# Patient Record
Sex: Male | Born: 1960 | Hispanic: Yes | Marital: Married | Smoking: Never smoker
Health system: Southern US, Community
[De-identification: ages and names within clinical notes are randomized; demographics above are authoritative.]

---

## 2013-10-26 ENCOUNTER — Emergency Department: Payer: Self-pay | Admitting: Emergency Medicine

## 2014-09-24 ENCOUNTER — Encounter: Admit: 2014-09-24 | Payer: Self-pay

## 2014-09-24 ENCOUNTER — Emergency Department: Payer: Self-pay

## 2014-09-24 ENCOUNTER — Emergency Department
Admission: EM | Admit: 2014-09-24 | Discharge: 2014-09-24 | Disposition: A | Discharge status: Home / Self Care | Payer: Self-pay | Attending: Emergency Medicine | Admitting: Emergency Medicine

## 2014-09-24 DIAGNOSIS — Z79899 Other long term (current) drug therapy: Secondary | ICD-10-CM | POA: Insufficient documentation

## 2014-09-24 DIAGNOSIS — R079 Chest pain, unspecified: Secondary | ICD-10-CM | POA: Insufficient documentation

## 2014-09-24 DIAGNOSIS — R1011 Right upper quadrant pain: Secondary | ICD-10-CM | POA: Insufficient documentation

## 2014-09-24 DIAGNOSIS — R112 Nausea with vomiting, unspecified: Secondary | ICD-10-CM | POA: Insufficient documentation

## 2014-09-24 DIAGNOSIS — R52 Pain, unspecified: Secondary | ICD-10-CM

## 2014-09-24 LAB — CBC WITH DIFFERENTIAL/PLATELET
BASOS ABS: 0.1 10*3/uL (ref 0–0.1)
Basophils Relative: 1 %
Eosinophils Absolute: 0.1 10*3/uL (ref 0–0.7)
Eosinophils Relative: 1 %
HCT: 42.1 % (ref 40.0–52.0)
HEMOGLOBIN: 14.5 g/dL (ref 13.0–18.0)
LYMPHS ABS: 2.1 10*3/uL (ref 1.0–3.6)
LYMPHS PCT: 23 %
MCH: 30.2 pg (ref 26.0–34.0)
MCHC: 34.5 g/dL (ref 32.0–36.0)
MCV: 87.7 fL (ref 80.0–100.0)
Monocytes Absolute: 0.6 10*3/uL (ref 0.2–1.0)
Monocytes Relative: 6 %
NEUTROS ABS: 6.5 10*3/uL (ref 1.4–6.5)
NEUTROS PCT: 69 %
PLATELETS: 186 10*3/uL (ref 150–440)
RBC: 4.8 MIL/uL (ref 4.40–5.90)
RDW: 13.8 % (ref 11.5–14.5)
WBC: 9.4 10*3/uL (ref 3.8–10.6)

## 2014-09-24 LAB — URINALYSIS COMPLETE WITH MICROSCOPIC (ARMC ONLY)
BACTERIA UA: NONE SEEN
Bilirubin Urine: NEGATIVE
GLUCOSE, UA: NEGATIVE mg/dL
HGB URINE DIPSTICK: NEGATIVE
Leukocytes, UA: NEGATIVE
NITRITE: NEGATIVE
PH: 8 (ref 5.0–8.0)
Protein, ur: NEGATIVE mg/dL
RBC / HPF: NONE SEEN RBC/hpf (ref 0–5)
Specific Gravity, Urine: 1.014 (ref 1.005–1.030)

## 2014-09-24 LAB — COMPREHENSIVE METABOLIC PANEL
ALT: 27 U/L (ref 17–63)
AST: 30 U/L (ref 15–41)
Albumin: 4.2 g/dL (ref 3.5–5.0)
Alkaline Phosphatase: 96 U/L (ref 38–126)
Anion gap: 10 (ref 5–15)
BUN: 11 mg/dL (ref 6–20)
CALCIUM: 8.8 mg/dL — AB (ref 8.9–10.3)
CO2: 26 mmol/L (ref 22–32)
Chloride: 103 mmol/L (ref 101–111)
Creatinine, Ser: 0.79 mg/dL (ref 0.61–1.24)
GFR calc Af Amer: >60 mL/min (ref >60)
GFR calc non Af Amer: >60 mL/min (ref >60)
GLUCOSE: 153 mg/dL — AB (ref 65–99)
POTASSIUM: 3.3 mmol/L — AB (ref 3.5–5.1)
Sodium: 139 mmol/L (ref 135–145)
TOTAL PROTEIN: 7.6 g/dL (ref 6.5–8.1)
Total Bilirubin: 2.3 mg/dL — ABNORMAL HIGH (ref 0.3–1.2)

## 2014-09-24 LAB — LIPASE, BLOOD: Lipase: 34 U/L (ref 22–51)

## 2014-09-24 LAB — TROPONIN I: Troponin I: <0.03 ng/mL (ref <0.031)

## 2014-09-24 MED ORDER — MORPHINE SULFATE 4 MG/ML IJ SOLN
INTRAMUSCULAR | Status: AC
  Administered 2014-09-24: 4 mg via INTRAVENOUS
  Filled 2014-09-24: qty 1

## 2014-09-24 MED ORDER — OMEPRAZOLE 40 MG PO CPDR: 40.0000 mg | DELAYED_RELEASE_CAPSULE | Freq: Every day | ORAL | Status: AC

## 2014-09-24 MED ORDER — KETOROLAC TROMETHAMINE 30 MG/ML IJ SOLN
30.0000 mg | Freq: Once | INTRAMUSCULAR | Status: AC
  Administered 2014-09-24: 30 mg via INTRAVENOUS

## 2014-09-24 MED ORDER — IOHEXOL 240 MG/ML SOLN
25.0000 mL | Freq: Once | INTRAMUSCULAR | Status: AC | PRN
  Administered 2014-09-24: 25 mL via ORAL

## 2014-09-24 MED ORDER — IOHEXOL 300 MG/ML  SOLN
100.0000 mL | Freq: Once | INTRAMUSCULAR | Status: AC | PRN
  Administered 2014-09-24: 100 mL via INTRAVENOUS

## 2014-09-24 MED ORDER — ONDANSETRON HCL 4 MG PO TABS: 4.0000 mg | ORAL_TABLET | Freq: Three times a day (TID) | ORAL | Status: AC | PRN

## 2014-09-24 MED ORDER — MORPHINE SULFATE 4 MG/ML IJ SOLN
INTRAMUSCULAR | Status: AC
  Administered 2014-09-24: 4 mg
  Filled 2014-09-24: qty 1

## 2014-09-24 MED ORDER — KETOROLAC TROMETHAMINE 30 MG/ML IJ SOLN
INTRAMUSCULAR | Status: AC
  Administered 2014-09-24: 30 mg via INTRAVENOUS
  Filled 2014-09-24: qty 8

## 2014-09-24 MED ORDER — MORPHINE SULFATE 4 MG/ML IJ SOLN
4.0000 mg | Freq: Once | INTRAMUSCULAR | Status: AC
  Administered 2014-09-24: 4 mg via INTRAVENOUS

## 2014-09-24 MED ORDER — ONDANSETRON HCL 4 MG/2ML IJ SOLN
INTRAMUSCULAR | Status: AC
  Administered 2014-09-24: 4 mg
  Filled 2014-09-24: qty 2

## 2014-09-24 NOTE — ED Provider Notes (Signed)
[facility] Emergency Department Provider Note    ____________________________________________  Time seen: 2:40 AM  I have reviewed the triage vital signs and the nursing notes.   HISTORY  Chief Complaint Chest Pain       HPI Corey Dickerson is a 54 y.o. male denies past medical history that presents with sudden onset of severe right upper quadrant abdominal pain radiating to the right side of the back and up into the ribs.  Please note initial triage note states chest pain, but with interpreter present patient states that he is not having any chest pain or chest related symptoms, rather he is having severe pain in his right upper stomach which radiates into the right side of his back.  This is been associated with nausea and vomiting once. He did feel sweaty earlier today. The pain began suddenly at 9:00 this evening and he is been waiting it out at home. Pain is continuing to be severe at 10 at this time. The pain is described as sharp.  Patient does report that approximately one week ago he had a similar episode that lasted less than 2 hours and resolved on its own at home. He did not seek medical care at that time.    Patient denies any fevers chills chest pain or shortness of breath. He denies any dysuria or pain with urination. He denies pain in the right lower abdomen. Normal bowel movement today.   History reviewed. No pertinent past medical history. patient denies past medical history. Patient denies taking any medications. Patient denies any allergies to any medications.  There are no active problems to display for this patient.   History reviewed. No pertinent past surgical history.  Current Outpatient Rx  Name  Route  Sig  Dispense  Refill  . omeprazole (PRILOSEC) 40 MG capsule   Oral   Take 1 capsule (40 mg total) by mouth daily.   30 capsule   1   . ondansetron (ZOFRAN) 4 MG tablet   Oral   Take 1 tablet (4 mg total) by mouth  every 8 (eight) hours as needed for nausea or vomiting.   30 tablet   1     Allergies Review of patient's allergies indicates no known allergies.  History reviewed. No pertinent family history.  Social History History  Substance Use Topics  . Smoking status: Never Smoker   . Smokeless tobacco: Never Used  . Alcohol Use: Yes   Patient is not a smoker, he does occasionally drink but not heavily or in excess. Patient denies any legal drug use.  Review of Systems  Constitutional: Negative for fever. Eyes: Negative for visual changes. ENT: Negative for sore throat. Cardiovascular: Negative for chest pain. Respiratory: Negative for shortness of breath. Gastrointestinal: See history of present illness Genitourinary: Negative for dysuria. Musculoskeletal: Negative for back pain. Skin: Negative for rash. Neurological: Negative for headaches, focal weakness or numbness.   10-point ROS otherwise negative.  ____________________________________________   PHYSICAL EXAM:  VITAL SIGNS: ED Triage Vitals  Enc Vitals Group     BP 09/24/14 0231 153/89 mmHg     Pulse Rate 09/24/14 0231 62     Resp 09/24/14 0231 18     Temp 09/24/14 0231 97.7 F (36.5 C)     Temp Source 09/24/14 0231 Oral     SpO2 09/24/14 0231 100 %     Weight 09/24/14 0228 170 lb (77.111 kg)     Height 09/24/14 0228 5\' 1"  (1.549 m)  Head Cir --      Peak Flow --      Pain Score 09/24/14 0228 10     Pain Loc --      Pain Edu? --      Excl. in GC? --      Constitutional: Alert and oriented. Well appearing and in obvious pain, patient is writhing on the bed unable to get comfortable holding his right hand over his right flank.  Eyes: Conjunctivae are normal. PERRL. Normal extraocular movements. ENT   Head: Normocephalic and atraumatic.   Nose: No congestion/rhinnorhea.   Mouth/Throat: Mucous membranes are moist.   Neck: No stridor. Hematological/Lymphatic/Immunilogical: No cervical  lymphadenopathy. Cardiovascular: Normal rate, regular rhythm. Normal and symmetric distal pulses are present in all extremities. No murmurs, rubs, or gallops. Respiratory: Normal respiratory effort without tachypnea nor retractions. Breath sounds are clear and equal bilaterally. No wheezes/rales/rhonchi. Gastrointestinal: Soft but tender notably in the epigastrium right upper quadrant and some mild pain in the right flank to palpation. There is no groin mass or groin edema or mass lesion noted in the groin region. The right lower quadrant is nontender left lower quadrant is nontender the epigastrium and right upper quadrant are notably tender to palpation.. No distention. No abdominal bruits. There is no CVA tenderness.  Musculoskeletal: Nontender with normal range of motion in all extremities. No joint effusions.  No lower extremity tenderness nor edema. Neurologic:  Normal speech and language. No gross focal neurologic deficits are appreciated. Speech is normal. No gait instability. Skin:  Skin is warm, dry and intact. No rash noted. Psychiatric: Mood and affect are normal. Speech and behavior are normal. Patient exhibits appropriate insight and judgment.  ____________________________________________    LABS (pertinent positives/negatives)  Labs Reviewed  COMPREHENSIVE METABOLIC PANEL - Abnormal; Notable for the following:    Potassium 3.3 (*)    Glucose, Bld 153 (*)    Calcium 8.8 (*)    Total Bilirubin 2.3 (*)    All other components within normal limits  URINALYSIS COMPLETEWITH MICROSCOPIC ([facility])  - Abnormal; Notable for the following:    Color, Urine YELLOW (*)    APPearance HAZY (*)    Ketones, ur TRACE (*)    Squamous Epithelial / LPF 0-5 (*)    All other components within normal limits  CBC WITH DIFFERENTIAL/PLATELET  LIPASE, BLOOD  TROPONIN I     ____________________________________________   EKG  EKG is normal sinus rhythm. Interpreted as a normal EKG. Ventricular  rate 60 PR interval 146 QRS duration 92 QT is 438 axis is normal. There are no ischemic ST changes.  ____________________________________________    RADIOLOGY  Cardiomeg and vasc congestion (per rads read). No free air.  (Clear lungs to exam, normal respirations, normal O2 sat in ER.)  ____________________________________________   PROCEDURES  Procedure(s) performed: None  Critical Care performed: No  ____________________________________________   INITIAL IMPRESSION / ASSESSMENT AND PLAN / ED COURSE  Pertinent labs & imaging results that were available during my care of the patient were reviewed by me and considered in my medical decision making (see chart for details).  Patient presents with severe onset of right upper quadrant and right flank pain radiating to his back. Patient denies cardiopulmonary symptoms. EKG is normal.  In consideration of symptoms and the fact that this is similar symptoms one week ago which have recurred today, reasonable differential at this time appears to be evaluation of the abdomen for consideration of cholelithiasis, nephrolithiasis, acute perforated viscus, in addition  we'll rule out myocardial infarction and obtain chest x-ray. However, patient's primary symptoms and evaluation suggested abdominal etiology for pain.  ----------------------------------------- 6:37 AM on 09/24/2014 -----------------------------------------  Patient reports pain free at present time. Reevaluated and reviewed lab results and ongoing plan with the patient via Spanish interpreter. Given the patient's severity of pain, and recurrence twice the last week, we will proceed with CT imaging to evaluate for possible causes. His total bilirubin is slightly elevated, which gives rise to possible biliary or gallbladder type pathology as etiology.  At this point with his normal EKG and negative troponin, I feel this is very unlikely to be cardiac or pulmonary disease. Reexam  and pain-free status at present time is quite reassuring.   Discussed with Dr. Marva PandaSkulskie. Recommends obtain HIDA scan in the ER today as could have a small (unseen) biliary mass. Discussed with patient, have ordered HIDA as the patient has no primary care physician or easy ability to obtain this study outpatient. If negative, will follow-up with GI Marva Panda(Skulskie) within 1 week. Dr. Marva PandaSkulskie agreeable to outpatient follow-up.  Discussed with Dr. York CeriseForbach plan of care to follow up on HIDA scan.  I discussed with the patient via the Spanish interpreter that even if his HIDA scan is normal and he is discharged today he will definitely need to see a gastroenterologist in follow-up within the next 2 weeks.  Patient and his daughter are both agreeable with the plan of care. At the present time the patient does remain pain-free.  ____________________________________________   FINAL CLINICAL IMPRESSION(S) / ED DIAGNOSES  Final diagnoses:  Right upper quadrant abdominal pain  Hyperbilirubinemia  Nausea and vomiting, vomiting of unspecified type     Sharyn CreamerMark Devin Foskey, MD 10/21/14 [phone]

## 2014-09-24 NOTE — Discharge Instructions (Signed)
It is very important that you go to outpatient imaging in the medical mall at 12:15 PM to have your HIDA scan. Do not have anything to eat or drink until after your scan. Dr. Reyes IvanSkulskie's office will call you with a follow-up appointment early next week, probably Monday. If you develop new or worsening symptoms that concern you, please return to the emergency department.   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 El hgado es el rgano que produce la bilis. La bilis se acumula en la vescula biliar donde es almacenada y concentrada Cuando es necesaria para el proceso digestivo, la bilis se excreta (es volcada) en el intestino delgado. Siempre es necesaria para el procesamiento de las grasas. Por ese motivo, los clicos biliares suelen producirse luego de comer alimentos ricos en grasas. Si un clculo bloquea el conducto (tubo) que conecta la vescula con el intestino delgado, se produce una inflamacin de la vescula (colecistitis). La gammagrafa hepatobiliar es una prueba para evaluar el sistema hepatobiliar (el hgado, la vescula y los conductos). INFORME AL PROFESIONAL QUE LO ASISTE SOBRE LOS SIGUIENTES PUNTOS:  Alergias.  Medicamentos que toma, incluidas hierbas, gotas oftlmicas, medicamentos de venta sin receta y cremas.  Uso de esteroides (por va oral o cremas).  Problemas anteriores debido a anestsicos o a Patent examinerla novocana.  Posibilidad de embarazo, si correspondiera.  Antecedentes de haber tenido cogulos sanguneos (tromboflebitis).  Antecedentes de hemorragias o problemas sanguneos.  Cirugas previas.  Otros problemas de [place] Lakes[place]ud. ANTES DEL PROCEDIMIENTO  No coma nada despus de la medianoche.  Podr tomar los medicamentos con una pequea cantidad de agua. Deber presentarse 60 minutos antes del  procedimiento a menos que el profesional que lo asiste le indique otra cosa. PROCEDIMIENTO  Le colocarn una aguja pequea en el brazo, que permanecer todo el tiempo que dure Lobbyistel examen.  Le inyectarn una pequea cantidad de un material que posee radiactividad de escasa duracin. Esto no debe ocasionarle efectos secundarios.  Mientras se encuentre acostado, colocarn una cmara especial sobre su abdomen (vientre) para Artistdetectar el material inyectado. Esta cmara tomar las imgenes en una pelcula que un radilogo (especialista en la lectura de radiografas) puede evaluar. Este procedimiento determinar cmo est funcionando la vescula biliar.  Luego le administrarn un material denominado "colecistoquintico". ste har que la vescula se contraiga. En ocasiones, esa sustancia produce los mismos sntomas (problemas) que un ataque de vescula o la sensacin de haber comido alimentos ricos en grasas.  La prueba completa demora alrededor Pilgrim's Pridede dos horas. El profesional que lo asiste podr darle ms precisin en el tiempo. Luego de la prueba, podr regresar a su casa y Designer, jewelleryretomar sus actividades normales y la dieta que se le indique. Consulte al profesional que lo asiste el modo en que podr Starbucks Corporationobtener los resultados. Recuerde que es responsabilidad suya retirar los Lubrizol Corporationresultados de las pruebas. No d por sentado que todo est normal si esta informacin no se la brinda el profesional que lo asiste. Document Released: 05/07/2005 Document Revised: 07/30/2011 [facility] Patient Information 2015 [place]ExitCare, [place]LL[place]. This information is not intended to replace advice given to you by your health care provider. Make sure you discuss any questions you have with your health care provider.    Dolor abdominal (Abdominal Pain)  It is very important you follow up with gastroenterology within one to 2 weeks. Please return to the ER right  away if you develop a fever, severe pain in the abdomen again, vomiting, can't stay  hydrated, or other new concerns or symptoms arise.  El dolor puede tener muchas causas. Normalmente la causa del dolor abdominal no es una enfermedad y Scientist, clinical (histocompatibility and immunogenetics)mejorar sin TEFL teachertratamiento. Frecuentemente puede controlarse y tratarse en casa. Su mdico le Medical sales representativerealizar un examen fsico y posiblemente solicite anlisis de sangre y radiografas para ayudar a Chief Strategy Officerdeterminar la gravedad de su dolor. Sin embargo, en IAC/InterActiveCorpmuchos casos, debe transcurrir ms tiempo antes de que se pueda Clinical research associateencontrar una causa evidente del dolor. Antes de llegar a ese punto, es posible que su mdico no sepa si necesita ms pruebas o un tratamiento ms profundo. INSTRUCCIONES PARA EL CUIDADO EN EL HOGAR  Est atento al dolor para ver si hay cambios. Las siguientes indicaciones ayudarn a Architectural technologistaliviar cualquier molestia que pueda sentir:  [place] solo medicamentos de venta libre o recetados, segn las indicaciones del mdico.  No tome laxantes a menos que se lo haya indicado su mdico.  Pruebe con Neomia Dearuna dieta lquida absoluta (caldo, t o agua) segn se lo indique su mdico. Introduzca gradualmente una dieta normal, segn su tolerancia. SOLICITE ATENCIN MDICA SI:  Tiene dolor abdominal sin explicacin.  Tiene dolor abdominal relacionado con nuseas o diarrea.  Tiene dolor cuando orina o defeca.  Experimenta dolor abdominal que lo despierta de noche.  Tiene dolor abdominal que empeora o mejora cuando come alimentos.  Tiene dolor abdominal que empeora cuando come alimentos grasosos.  Tiene fiebre. SOLICITE ATENCIN MDICA DE INMEDIATO SI:   El dolor no desaparece en un plazo mximo de 2horas.  No deja de (vomitar).  El Engineer, miningdolor se siente solo en partes del abdomen, como el lado derecho o la parte inferior izquierda del abdomen.  Evaca materia fecal sanguinolenta o negra, de aspecto alquitranado. ASEGRESE DE QUE:  Comprende estas instrucciones.  Controlar su afeccin.  Recibir ayuda de inmediato si no mejora o si empeora. Document  Released: 05/07/2005 Document Revised: 05/12/2013 [facility] MontgomeryExitCare Patient Information 2015 [employer] Heart UniversityExitCare, MarylandLLC. This information is not intended to replace advice given to you by your health care provider. Make sure you discuss any questions you have with your health care provider.

## 2014-09-24 NOTE — ED Notes (Signed)
Pt drinking oral contrast, NAD noted.

## 2014-09-24 NOTE — ED Provider Notes (Signed)
-----------------------------------------   7:30 AM on 09/24/2014 -----------------------------------------  Assuming care from Dr. Fanny BienQuale.  In short, Corey Dickerson is a 54 y.o. male with a chief complaint of Chest Pain  which actually turned out to be more right upper quadrant pain.  Refer to the original H&P for additional details.  The current plan of care is to have Dr. Fanny BienQuale speak with Dr. Marva PandaSkulskie and determine recommendations.  ----------------------------------------- 9:22 AM on 09/24/2014 -----------------------------------------  Dr. Marva PandaSkulskie recommended a HIDA scan which Dr. Fanny BienQuale ordered. However, the patient cannot have this until after 1 PM, and it is not appropriate to keep him in the emergency department for that length of time. I spoke again with Dr. Marva PandaSkulskie who recommended contacting radiology and scheduling an outpatient HIDA scan for today rather than admitting the patient simply for the study. Dr. Marva PandaSkulskie will then schedule a follow-up appointment with the patient for Monday. My secretary is currently on the phone with radiology and attempt to schedule the test for today.  ----------------------------------------- 10:12 AM on 09/24/2014 -----------------------------------------  We arrange for an outpatient study at 1 PM today. The patient is to arrive at 12:15 PM at outpatient imaging. I discussed this with the patient via an interpreter to make sure there is no misunderstanding. The patient understands he is to remain nothing by mouth. The patient also understands that Dr. Reyes IvanSkulskie's office will contact him for follow-up appointment on Monday. He is in no acute distress, ambulates well, and I'm providing a prescription for Zofran and omeprazole as per Dr. Reyes IvanSkulskie's recommendations. I gave him my usual and customary return precautions.  Corey Roseory Finola Rosal, MD 09/24/14 1013

## 2014-09-24 NOTE — ED Notes (Signed)
Chest pain x 1 week today became worse, has had vomiting and diaphoresis.

## 2014-09-24 NOTE — ED Notes (Signed)
   09/24/14 0313  Abdominal  Gastrointestinal (WDL) X  Tenderness Tender (epigastric and right upper quad that moves into back)  Pt c/o epigastric pain x 1 week but worsening tonight at 9pm.

## 2014-09-24 NOTE — ED Notes (Signed)
Patient transported to CT 

## 2014-09-24 NOTE — ED Notes (Signed)
   09/24/14 0312  Cardiac  Cardiac (WDL) WDL  Bedside Cardiac Monitor On Yes  Bedside Cardiac Audible Yes  Bedside Cardiac Alarms Set Yes  NSR

## 2014-09-24 NOTE — ED Notes (Signed)
Pt continues to drink contrast.

## 2016-02-01 ENCOUNTER — Ambulatory Visit: Payer: Self-pay | Admitting: Ophthalmology

## 2016-02-14 ENCOUNTER — Ambulatory Visit: Payer: Self-pay

## 2016-02-23 ENCOUNTER — Ambulatory Visit: Payer: Self-pay | Admitting: Ophthalmology

## 2016-03-01 ENCOUNTER — Ambulatory Visit: Payer: Self-pay | Admitting: Ophthalmology

## 2016-04-21 IMAGING — CT CT ABD-PELV W/ CM
2 of 5 series · 16 of 46 positions shown, 18 images · IV contrast (omnipaque)
Comparison: None.

CLINICAL DATA: Vomiting and diaphoresis. Right upper quadrant pain
radiating toward back

EXAM:
CT ABDOMEN AND PELVIS WITH CONTRAST
TECHNIQUE: Multidetector CT imaging of the abdomen and pelvis was performed
using the standard protocol following bolus administration of
intravenous contrast. Oral contrast was also administered.
CONTRAST:  100mL OMNIPAQUE IOHEXOL 300 MG/ML  SOLN

[Series 2: routine abd pel with · axial · 0.75mm/px · z∈[-770,-356]mm · 13 of 93 slices shown, 15 images]
[im 5/93  soft-tissue]
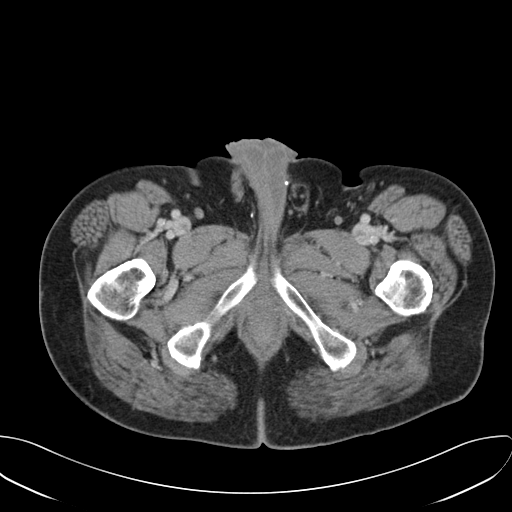
[im 5/93  bone]
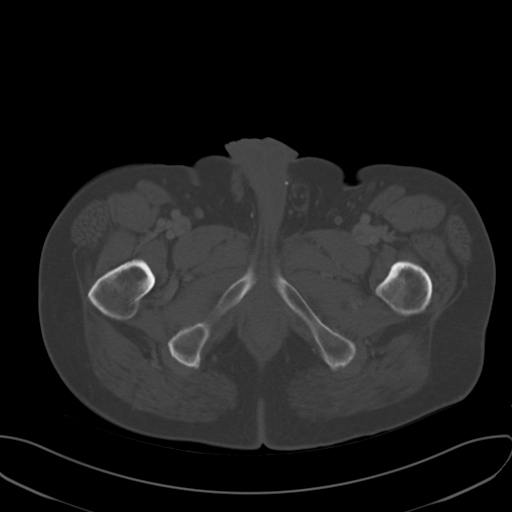
[im 15/93  soft-tissue]
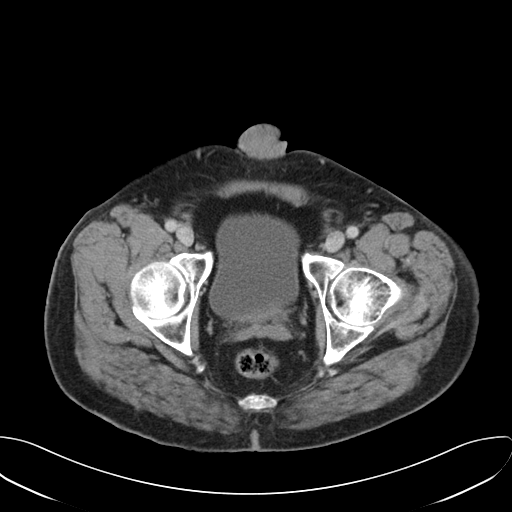
[im 20/93  soft-tissue]
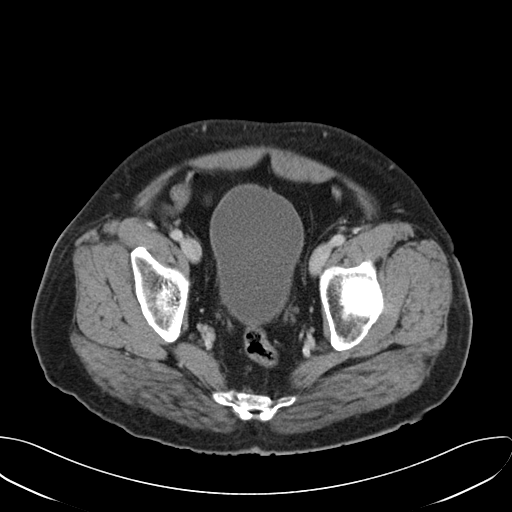
[im 25/93  soft-tissue]
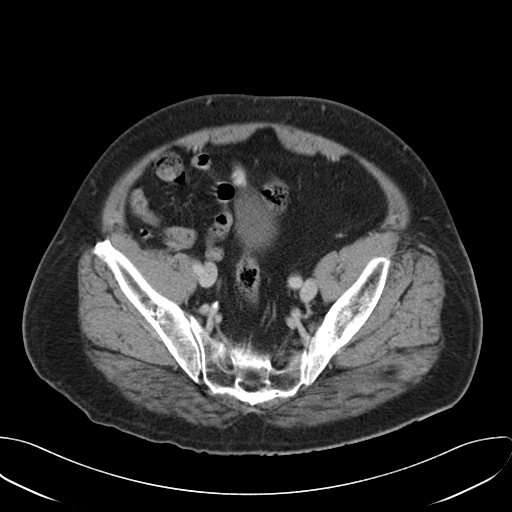
[im 34/93  soft-tissue]
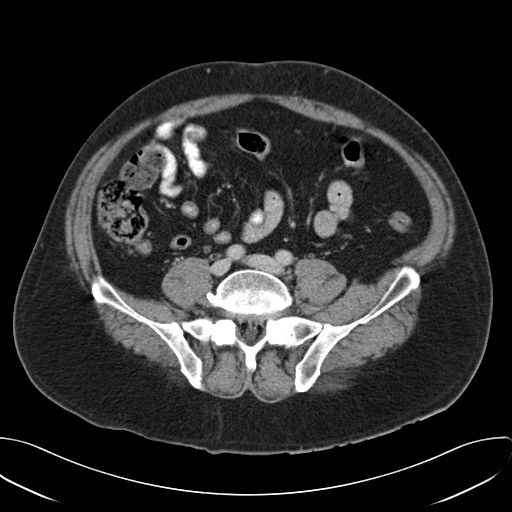
[im 39/93  soft-tissue]
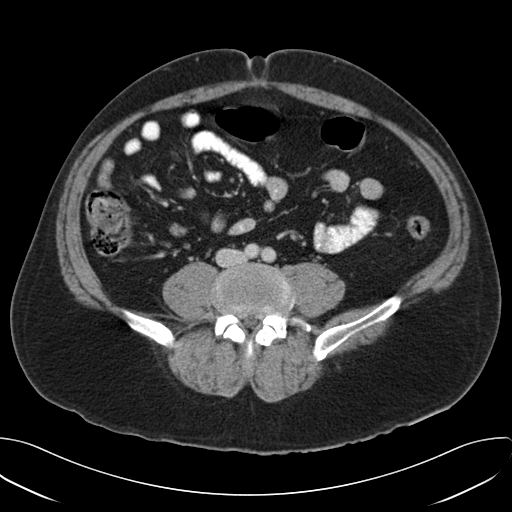
[im 49/93  soft-tissue]
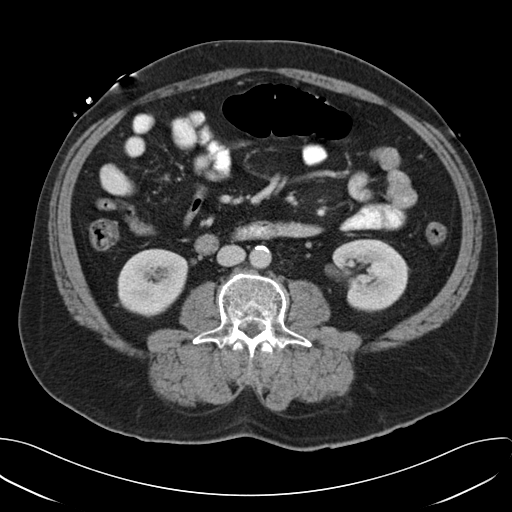
[im 54/93  soft-tissue]
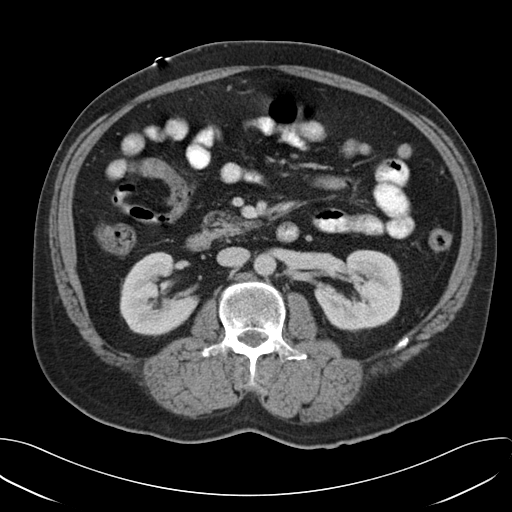
[im 59/93  soft-tissue]
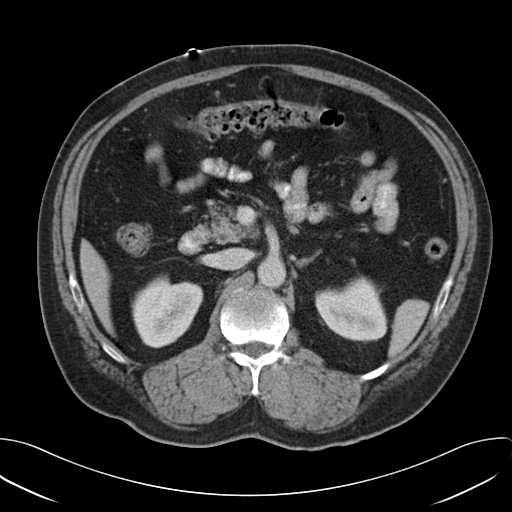
[im 59/93  bone]
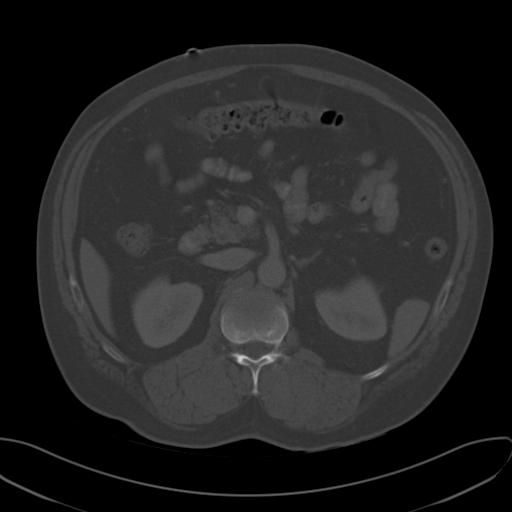
[im 68/93  soft-tissue]
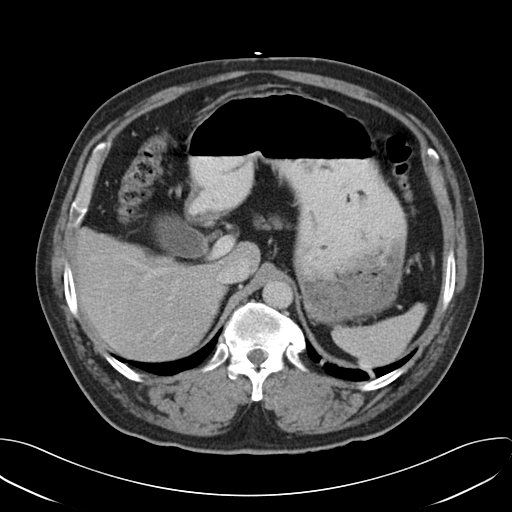
[im 73/93  soft-tissue]
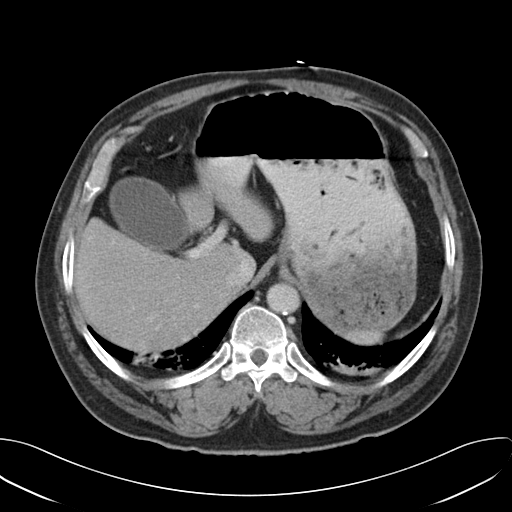
[im 78/93  soft-tissue]
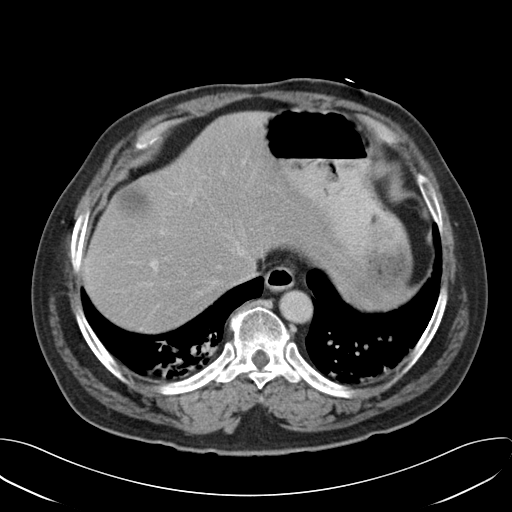
[im 88/93  soft-tissue]
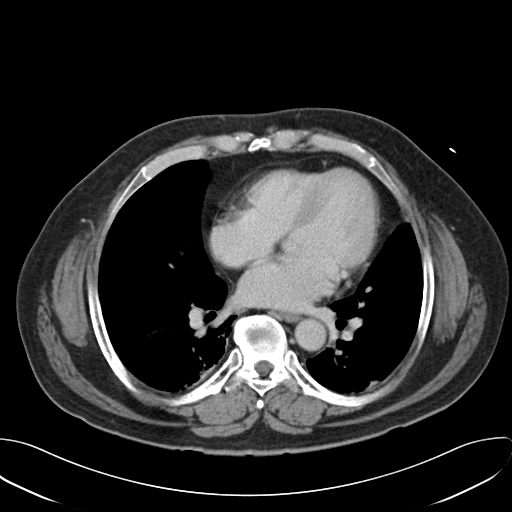

[Series 6: cor routine abd pel with · coronal · 0.96mm/px · 3 of 157 slices shown]
[im 53/157  soft-tissue]
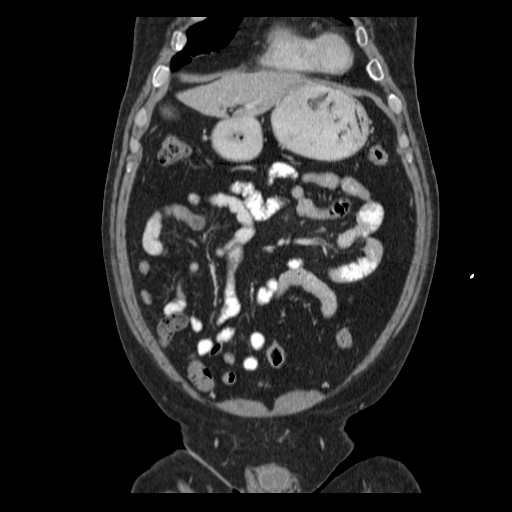
[im 70/157  soft-tissue]
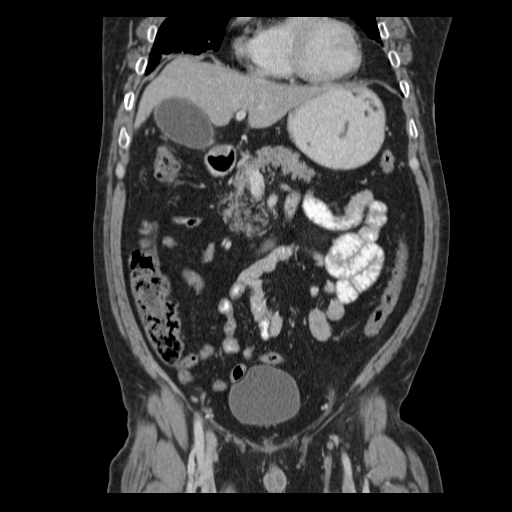
[im 87/157  soft-tissue]
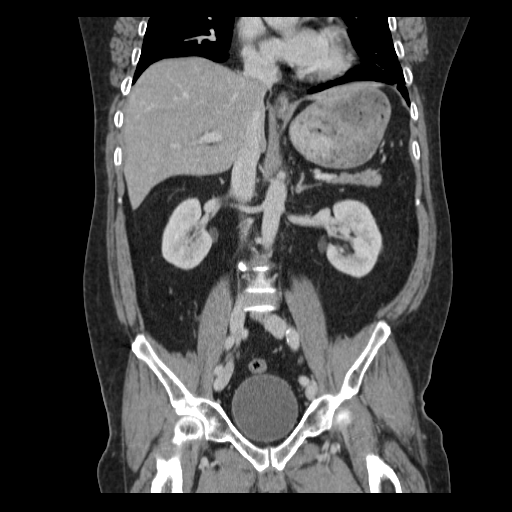

[16 of 46 positions shown; findings below may reference images not displayed]

FINDINGS: There is patchy bibasilar atelectasis. There is lower lobe
bronchiectatic change. Oral contrast is seen in the distal
esophagus.

There is hepatic steatosis. No focal liver lesions are identified.
The gallbladder appears modestly distended without wall thickening
or pericholecystic fluid. No gallstones are seen by CT. There is no
appreciable biliary duct dilatation.

Spleen, pancreas, and adrenals appear normal. Kidneys bilaterally
show no evidence of mass or hydronephrosis on either side. There is
no renal or ureteral calculus on either side.

In the pelvis, the urinary bladder is midline with normal wall
thickness. Prostate is mildly prominent. There is a single small
calcification in the prostate. There is no pelvic mass or pelvic
fluid collection. Appendix appears normal.

The stomach is filled with food material. There is no bowel
obstruction. No free air or portal venous air. There is no
appreciable ascites, adenopathy, or abscess in the abdomen or
pelvis. There is no abdominal aortic aneurysm. There is
atherosclerotic calcification in the aorta. There is anterior
wedging of several lower thoracic vertebral bodies as well as the L1
vertebral body. There are no blastic or lytic bone lesions.
IMPRESSION: Gallbladder appears mildly distended without appreciable wall
thickening or pericholecystic fluid.

Hepatic steatosis.

Stomach is distended with food material. There is no bowel
obstruction. No evidence of abscess. Appendix appears normal. Oral
contrast is seen in the distal esophagus. Suspect a degree of
gastroesophageal reflux.

Prostate borderline prominent. This finding may warrant correlation
with PSA.

Patchy bibasilar atelectatic change. There is lower lobe
bronchiectasis.

Anterior wedging of several vertebral bodies, age uncertain.
